# Patient Record
Sex: Female | Born: 1985 | Race: Black or African American | Hispanic: No | Marital: Married | Smoking: Never smoker
Health system: Southern US, Community
[De-identification: ages and names within clinical notes are randomized; demographics above are authoritative.]

## PROBLEM LIST (undated history)

## (undated) ENCOUNTER — Inpatient Hospital Stay (HOSPITAL_COMMUNITY): Payer: Self-pay

## (undated) DIAGNOSIS — Z8619 Personal history of other infectious and parasitic diseases: Secondary | ICD-10-CM

## (undated) DIAGNOSIS — N920 Excessive and frequent menstruation with regular cycle: Secondary | ICD-10-CM

## (undated) HISTORY — PX: OTHER SURGICAL HISTORY: SHX169

## (undated) HISTORY — PX: NO PAST SURGERIES: SHX2092

## (undated) HISTORY — DX: Personal history of other infectious and parasitic diseases: Z86.19

---

## 2005-09-25 ENCOUNTER — Emergency Department (HOSPITAL_COMMUNITY): Admission: EM | Admit: 2005-09-25 | Discharge: 2005-09-25 | Payer: Self-pay | Admitting: Emergency Medicine

## 2005-12-30 ENCOUNTER — Emergency Department (HOSPITAL_COMMUNITY): Admission: EM | Admit: 2005-12-30 | Discharge: 2005-12-30 | Payer: Self-pay | Admitting: Emergency Medicine

## 2010-08-25 LAB — RPR: RPR: NONREACTIVE

## 2010-08-25 LAB — HIV ANTIBODY (ROUTINE TESTING W REFLEX): HIV: NONREACTIVE

## 2010-08-25 LAB — ANTIBODY SCREEN: Antibody Screen: NEGATIVE

## 2010-08-25 LAB — ABO/RH: RH Type: POSITIVE

## 2011-01-09 ENCOUNTER — Other Ambulatory Visit (HOSPITAL_COMMUNITY): Payer: Self-pay | Admitting: Obstetrics and Gynecology

## 2011-01-09 DIAGNOSIS — IMO0001 Reserved for inherently not codable concepts without codable children: Secondary | ICD-10-CM

## 2011-01-16 ENCOUNTER — Other Ambulatory Visit (HOSPITAL_COMMUNITY): Payer: Self-pay | Admitting: Obstetrics and Gynecology

## 2011-01-16 ENCOUNTER — Ambulatory Visit (HOSPITAL_COMMUNITY)
Admission: RE | Admit: 2011-01-16 | Discharge: 2011-01-16 | Disposition: A | Discharge status: Home / Self Care | Payer: BC Managed Care – PPO | Source: Ambulatory Visit | Attending: Obstetrics and Gynecology | Admitting: Obstetrics and Gynecology

## 2011-01-16 ENCOUNTER — Encounter (HOSPITAL_COMMUNITY): Payer: Self-pay

## 2011-01-16 DIAGNOSIS — Z3689 Encounter for other specified antenatal screening: Secondary | ICD-10-CM | POA: Insufficient documentation

## 2011-01-16 DIAGNOSIS — IMO0001 Reserved for inherently not codable concepts without codable children: Secondary | ICD-10-CM

## 2011-01-16 DIAGNOSIS — O30009 Twin pregnancy, unspecified number of placenta and unspecified number of amniotic sacs, unspecified trimester: Secondary | ICD-10-CM | POA: Insufficient documentation

## 2011-01-17 ENCOUNTER — Other Ambulatory Visit: Payer: Self-pay

## 2011-01-19 ENCOUNTER — Other Ambulatory Visit (HOSPITAL_COMMUNITY): Payer: Self-pay | Admitting: Maternal and Fetal Medicine

## 2011-01-19 ENCOUNTER — Ambulatory Visit (HOSPITAL_COMMUNITY)
Admission: RE | Admit: 2011-01-19 | Discharge: 2011-01-19 | Disposition: A | Discharge status: Home / Self Care | Payer: BC Managed Care – PPO | Source: Ambulatory Visit | Attending: Obstetrics and Gynecology | Admitting: Obstetrics and Gynecology

## 2011-01-19 DIAGNOSIS — Z3689 Encounter for other specified antenatal screening: Secondary | ICD-10-CM | POA: Insufficient documentation

## 2011-01-19 DIAGNOSIS — IMO0001 Reserved for inherently not codable concepts without codable children: Secondary | ICD-10-CM

## 2011-01-19 DIAGNOSIS — O30009 Twin pregnancy, unspecified number of placenta and unspecified number of amniotic sacs, unspecified trimester: Secondary | ICD-10-CM | POA: Insufficient documentation

## 2011-01-26 ENCOUNTER — Other Ambulatory Visit (HOSPITAL_COMMUNITY): Payer: Self-pay | Admitting: Maternal and Fetal Medicine

## 2011-01-26 ENCOUNTER — Ambulatory Visit (HOSPITAL_COMMUNITY)
Admission: RE | Admit: 2011-01-26 | Discharge: 2011-01-26 | Disposition: A | Discharge status: Home / Self Care | Payer: BC Managed Care – PPO | Source: Ambulatory Visit | Attending: Obstetrics and Gynecology | Admitting: Obstetrics and Gynecology

## 2011-01-26 DIAGNOSIS — O30039 Twin pregnancy, monochorionic/diamniotic, unspecified trimester: Secondary | ICD-10-CM | POA: Insufficient documentation

## 2011-01-26 DIAGNOSIS — IMO0001 Reserved for inherently not codable concepts without codable children: Secondary | ICD-10-CM

## 2011-01-26 DIAGNOSIS — Z3689 Encounter for other specified antenatal screening: Secondary | ICD-10-CM | POA: Insufficient documentation

## 2011-01-26 DIAGNOSIS — O30009 Twin pregnancy, unspecified number of placenta and unspecified number of amniotic sacs, unspecified trimester: Secondary | ICD-10-CM | POA: Insufficient documentation

## 2011-01-27 ENCOUNTER — Other Ambulatory Visit (HOSPITAL_COMMUNITY): Payer: Self-pay | Admitting: Obstetrics and Gynecology

## 2011-01-27 DIAGNOSIS — IMO0001 Reserved for inherently not codable concepts without codable children: Secondary | ICD-10-CM

## 2011-01-30 ENCOUNTER — Ambulatory Visit (HOSPITAL_COMMUNITY)
Admission: RE | Admit: 2011-01-30 | Discharge: 2011-01-30 | Disposition: A | Discharge status: Home / Self Care | Payer: BC Managed Care – PPO | Source: Ambulatory Visit | Attending: Obstetrics and Gynecology | Admitting: Obstetrics and Gynecology

## 2011-01-30 DIAGNOSIS — Z3689 Encounter for other specified antenatal screening: Secondary | ICD-10-CM | POA: Insufficient documentation

## 2011-01-30 DIAGNOSIS — O30009 Twin pregnancy, unspecified number of placenta and unspecified number of amniotic sacs, unspecified trimester: Secondary | ICD-10-CM | POA: Insufficient documentation

## 2011-01-30 LAB — STREP B DNA PROBE

## 2011-02-01 ENCOUNTER — Encounter (HOSPITAL_COMMUNITY): Payer: Self-pay | Admitting: *Deleted

## 2011-02-01 ENCOUNTER — Telehealth (HOSPITAL_COMMUNITY): Payer: Self-pay | Admitting: *Deleted

## 2011-02-01 NOTE — Telephone Encounter (Signed)
Preadmission screen  

## 2011-02-02 ENCOUNTER — Ambulatory Visit (HOSPITAL_COMMUNITY): Payer: BC Managed Care – PPO

## 2011-02-02 ENCOUNTER — Encounter (HOSPITAL_COMMUNITY): Payer: Self-pay | Admitting: *Deleted

## 2011-02-02 ENCOUNTER — Ambulatory Visit (HOSPITAL_COMMUNITY): Admission: RE | Admit: 2011-02-02 | Payer: BC Managed Care – PPO | Source: Ambulatory Visit

## 2011-02-02 ENCOUNTER — Inpatient Hospital Stay (HOSPITAL_COMMUNITY)
Admission: AD | Admit: 2011-02-02 | Discharge: 2011-02-02 | Disposition: A | Discharge status: Home / Self Care | Payer: BC Managed Care – PPO | Source: Ambulatory Visit | Attending: Obstetrics and Gynecology | Admitting: Obstetrics and Gynecology

## 2011-02-02 DIAGNOSIS — O219 Vomiting of pregnancy, unspecified: Secondary | ICD-10-CM

## 2011-02-02 DIAGNOSIS — O30039 Twin pregnancy, monochorionic/diamniotic, unspecified trimester: Secondary | ICD-10-CM | POA: Insufficient documentation

## 2011-02-02 DIAGNOSIS — O30009 Twin pregnancy, unspecified number of placenta and unspecified number of amniotic sacs, unspecified trimester: Secondary | ICD-10-CM | POA: Insufficient documentation

## 2011-02-02 DIAGNOSIS — O365990 Maternal care for other known or suspected poor fetal growth, unspecified trimester, not applicable or unspecified: Secondary | ICD-10-CM | POA: Insufficient documentation

## 2011-02-02 DIAGNOSIS — K529 Noninfective gastroenteritis and colitis, unspecified: Secondary | ICD-10-CM

## 2011-02-02 MED ORDER — ONDANSETRON 8 MG PO TBDP
8.0000 mg | ORAL_TABLET | Freq: Once | ORAL | Status: AC
  Administered 2011-02-02: 8 mg via ORAL
  Filled 2011-02-02: qty 1

## 2011-02-02 MED ORDER — LACTATED RINGERS IV BOLUS (SEPSIS)
1000.0000 mL | Freq: Once | INTRAVENOUS | Status: AC
  Administered 2011-02-02: 1000 mL via INTRAVENOUS

## 2011-02-02 MED ORDER — ONDANSETRON HCL 4 MG PO TABS: 4.0000 mg | ORAL_TABLET | Freq: Four times a day (QID) | ORAL | Status: AC

## 2011-02-02 NOTE — ED Notes (Signed)
States hx of motion sickness, gets dizzy waves with sudden movement, or when going backwards.  Not a new problem.

## 2011-02-02 NOTE — ED Notes (Signed)
Monitor for baby A not recording/printing correctly.  rm changed.

## 2011-02-02 NOTE — ED Notes (Signed)
Many questions ( induction, lactation/pump rental, cord blood donor)

## 2011-02-02 NOTE — ED Provider Notes (Signed)
History   Pt is a 26 yo G1P0 @ 35+[redacted] weeks pregnant with mono/di twins being followed jointly with MFM for Growth discordance.  Pt presents to [facility]U c/o Nausea and vomiting that started this morning at 5 am. She has had recurrent episodes of emesis.  Denies Fever, chills, diarrhea.  Chief Complaint  Patient presents with  . Nausea   HPI    Past Medical History  Diagnosis Date  . History of chicken pox   . History of chlamydia     Past Surgical History  Procedure Date  . No past surgeries     Family History  Problem Relation Age of Onset  . Anemia Sister   . Hypertension Maternal Aunt   . Diabetes Maternal Aunt   . Cancer Maternal Aunt     breast x3  . Drug abuse Paternal Aunt   . Drug abuse Paternal Uncle   . Cancer Maternal Grandmother     breast  . Alcohol abuse Maternal Grandfather   . Cancer Paternal Grandfather     prostate  . Anesthesia problems Neg Hx     History  Substance Use Topics  . Smoking status: Never Smoker   . Smokeless tobacco: Never Used  . Alcohol Use: No    Allergies: No Known Allergies  Prescriptions prior to admission  Medication Sig Dispense Refill  . PRENATAL VITAMINS PO Take by mouth.          ROS Physical Exam   Blood pressure 137/88, pulse 83, temperature 97.9 F (36.6 C), temperature source Oral, resp. rate 18, last menstrual period 05/28/2010.  Physical Exam  Aox3, NAD Gravid, soft, ND Cvx 1 /50/-2 per RN check, unchanged since last office exam toco irritability with q3-5 contractions FHTs 130 + accels no decels, reactive  150 + accels no decels, reactive   MAU Course  Procedures: IVF hydration and antiemetics   Assessment and Plan  1) Continue IVF hydration 2) D/C home with zofran 3) Per MFM recommendation, patient is scheduled for induction on Sunday PM @ 730 due to growth discordance.  Fetuses are vertex/vertex. Pt and husband are aware of plan of care.  Tapanga Ottaway H. 02/02/2011, 11:05 AM

## 2011-02-02 NOTE — Progress Notes (Signed)
Nausea started last night, vomiting this morning. Denies fever, had loose stool yesterday morning. No one else at home is sick

## 2011-02-03 ENCOUNTER — Ambulatory Visit (HOSPITAL_COMMUNITY)
Admit: 2011-02-03 | Discharge: 2011-02-03 | Disposition: A | Discharge status: Home / Self Care | Payer: BC Managed Care – PPO | Attending: Obstetrics and Gynecology | Admitting: Obstetrics and Gynecology

## 2011-02-03 ENCOUNTER — Other Ambulatory Visit (HOSPITAL_COMMUNITY): Payer: Self-pay | Admitting: Obstetrics and Gynecology

## 2011-02-03 DIAGNOSIS — IMO0001 Reserved for inherently not codable concepts without codable children: Secondary | ICD-10-CM

## 2011-02-04 ENCOUNTER — Encounter (HOSPITAL_COMMUNITY): Payer: Self-pay | Admitting: Pharmacist

## 2011-02-05 ENCOUNTER — Inpatient Hospital Stay (HOSPITAL_COMMUNITY)
Admission: RE | Admit: 2011-02-05 | Discharge: 2011-02-09 | DRG: 370 | Disposition: A | Discharge status: Home / Self Care | Payer: BC Managed Care – PPO | Source: Ambulatory Visit | Attending: Obstetrics & Gynecology | Admitting: Obstetrics & Gynecology

## 2011-02-05 DIAGNOSIS — D500 Iron deficiency anemia secondary to blood loss (chronic): Secondary | ICD-10-CM | POA: Diagnosis present

## 2011-02-05 DIAGNOSIS — O30009 Twin pregnancy, unspecified number of placenta and unspecified number of amniotic sacs, unspecified trimester: Principal | ICD-10-CM | POA: Diagnosis present

## 2011-02-05 DIAGNOSIS — O30039 Twin pregnancy, monochorionic/diamniotic, unspecified trimester: Secondary | ICD-10-CM

## 2011-02-05 DIAGNOSIS — O9902 Anemia complicating childbirth: Secondary | ICD-10-CM | POA: Diagnosis present

## 2011-02-05 LAB — CBC
MCH: 30.1 pg (ref 26.0–34.0)
MCHC: 33.2 g/dL (ref 30.0–36.0)
MCV: 90.4 fL (ref 78.0–100.0)
Platelets: 197 10*3/uL (ref 150–400)
RBC: 3.66 MIL/uL — ABNORMAL LOW (ref 3.87–5.11)
RDW: 13.7 % (ref 11.5–15.5)

## 2011-02-05 MED ORDER — IBUPROFEN 600 MG PO TABS: 600.0000 mg | ORAL_TABLET | Freq: Four times a day (QID) | ORAL | Status: DC | PRN

## 2011-02-05 MED ORDER — OXYCODONE-ACETAMINOPHEN 5-325 MG PO TABS: 2.0000 | ORAL_TABLET | ORAL | Status: DC | PRN

## 2011-02-05 MED ORDER — LACTATED RINGERS IV SOLN
INTRAVENOUS | Status: DC
  Administered 2011-02-05 – 2011-02-06 (×2): via INTRAVENOUS

## 2011-02-05 MED ORDER — LACTATED RINGERS IV SOLN
500.0000 mL | INTRAVENOUS | Status: DC | PRN
  Administered 2011-02-06 (×3): via INTRAVENOUS

## 2011-02-05 MED ORDER — TERBUTALINE SULFATE 1 MG/ML IJ SOLN: 0.2500 mg | Freq: Once | INTRAMUSCULAR | Status: AC | PRN

## 2011-02-05 MED ORDER — LIDOCAINE HCL (PF) 1 % IJ SOLN: 30.0000 mL | INTRAMUSCULAR | Status: DC | PRN

## 2011-02-05 MED ORDER — BUTORPHANOL TARTRATE 2 MG/ML IJ SOLN: 1.0000 mg | INTRAMUSCULAR | Status: DC | PRN

## 2011-02-05 MED ORDER — ONDANSETRON HCL 4 MG/2ML IJ SOLN: 4.0000 mg | Freq: Four times a day (QID) | INTRAMUSCULAR | Status: DC | PRN

## 2011-02-05 MED ORDER — MISOPROSTOL 25 MCG QUARTER TABLET
25.0000 ug | ORAL_TABLET | ORAL | Status: DC | PRN
  Administered 2011-02-05 – 2011-02-06 (×2): 25 ug via VAGINAL
  Filled 2011-02-05 (×2): qty 0.25

## 2011-02-05 MED ORDER — CITRIC ACID-SODIUM CITRATE 334-500 MG/5ML PO SOLN
30.0000 mL | ORAL | Status: DC | PRN
  Administered 2011-02-06: 30 mL via ORAL
  Filled 2011-02-05: qty 15

## 2011-02-05 MED ORDER — OXYTOCIN 20 UNITS IN LACTATED RINGERS INFUSION - SIMPLE: 125.0000 mL/h | Freq: Once | INTRAVENOUS | Status: DC

## 2011-02-05 MED ORDER — OXYTOCIN BOLUS FROM INFUSION
500.0000 mL | Freq: Once | INTRAVENOUS | Status: DC
  Filled 2011-02-05: qty 500

## 2011-02-05 MED ORDER — FLEET ENEMA 7-19 GM/118ML RE ENEM: 1.0000 | ENEMA | RECTAL | Status: DC | PRN

## 2011-02-05 MED ORDER — ACETAMINOPHEN 325 MG PO TABS: 650.0000 mg | ORAL_TABLET | ORAL | Status: DC | PRN

## 2011-02-05 NOTE — H&P (Signed)
26 y.o. G1P0  Estimated Date of Delivery: 03/04/11 admitted at 36/[redacted] weeks gestation for induction. Prenatal course was complicated by dichorionic, monoamniotic twins.   Prenatal labs: Blood Type:A+.  Screening tests for HIV, Syphilis, Hepatitis B, Rubella sensitivity, fetal anomalies, gestational diabetes, and perineal group B strep colonization were negative.    Afebrile, VSS Heart and Lungs: No active disease Abdomen: soft, gravid, EFW AGA. Cervical exam:  1/50  Impression: Di/Mono Twins at 36 weeks  Plan:  Induction

## 2011-02-06 ENCOUNTER — Ambulatory Visit (HOSPITAL_COMMUNITY): Payer: BC Managed Care – PPO

## 2011-02-06 ENCOUNTER — Encounter (HOSPITAL_COMMUNITY): Payer: Self-pay

## 2011-02-06 ENCOUNTER — Encounter (HOSPITAL_COMMUNITY)
Admission: RE | Disposition: A | Discharge status: Home / Self Care | Payer: Self-pay | Source: Ambulatory Visit | Attending: Obstetrics & Gynecology

## 2011-02-06 ENCOUNTER — Other Ambulatory Visit: Payer: Self-pay | Admitting: Obstetrics & Gynecology

## 2011-02-06 SURGERY — Surgical Case
Anesthesia: Spinal | Site: Abdomen | Wound class: Clean Contaminated

## 2011-02-06 MED ORDER — OXYCODONE-ACETAMINOPHEN 5-325 MG PO TABS: 1.0000 | ORAL_TABLET | ORAL | Status: DC | PRN

## 2011-02-06 MED ORDER — OXYTOCIN 20 UNITS IN LACTATED RINGERS INFUSION - SIMPLE
INTRAVENOUS | Status: AC
  Filled 2011-02-06: qty 1000

## 2011-02-06 MED ORDER — SENNOSIDES-DOCUSATE SODIUM 8.6-50 MG PO TABS
2.0000 | ORAL_TABLET | Freq: Every day | ORAL | Status: DC
Start: 2011-02-06 — End: 2011-02-09
  Administered 2011-02-06 – 2011-02-08 (×3): 2 via ORAL

## 2011-02-06 MED ORDER — MEPERIDINE HCL 25 MG/ML IJ SOLN: 6.2500 mg | INTRAMUSCULAR | Status: DC | PRN

## 2011-02-06 MED ORDER — NALOXONE HCL 0.4 MG/ML IJ SOLN: 1.0000 ug/kg/h | INTRAMUSCULAR | Status: DC | PRN

## 2011-02-06 MED ORDER — DIBUCAINE 1 % RE OINT: 1.0000 "application " | TOPICAL_OINTMENT | RECTAL | Status: DC | PRN

## 2011-02-06 MED ORDER — OXYTOCIN 10 UNIT/ML IJ SOLN
INTRAMUSCULAR | Status: DC | PRN
  Administered 2011-02-06: 20 [IU]
  Administered 2011-02-06: 20 [IU] via INTRAMUSCULAR

## 2011-02-06 MED ORDER — SIMETHICONE 80 MG PO CHEW
80.0000 mg | CHEWABLE_TABLET | Freq: Three times a day (TID) | ORAL | Status: DC
Start: 2011-02-06 — End: 2011-02-09
  Administered 2011-02-06 – 2011-02-09 (×9): 80 mg via ORAL

## 2011-02-06 MED ORDER — KETOROLAC TROMETHAMINE 30 MG/ML IJ SOLN: 15.0000 mg | Freq: Once | INTRAMUSCULAR | Status: DC | PRN

## 2011-02-06 MED ORDER — OXYTOCIN 20 UNITS IN LACTATED RINGERS INFUSION - SIMPLE
125.0000 mL/h | INTRAVENOUS | Status: AC
  Administered 2011-02-06: 125 mL/h via INTRAVENOUS

## 2011-02-06 MED ORDER — KETOROLAC TROMETHAMINE 60 MG/2ML IM SOLN
60.0000 mg | Freq: Once | INTRAMUSCULAR | Status: AC | PRN
  Filled 2011-02-06: qty 2

## 2011-02-06 MED ORDER — TETANUS-DIPHTH-ACELL PERTUSSIS 5-2.5-18.5 LF-MCG/0.5 IM SUSP: 0.5000 mL | Freq: Once | INTRAMUSCULAR | Status: DC

## 2011-02-06 MED ORDER — CEFAZOLIN SODIUM-DEXTROSE 2-3 GM-% IV SOLR
2.0000 g | Freq: Once | INTRAVENOUS | Status: AC
  Administered 2011-02-06: 2 g via INTRAVENOUS
  Filled 2011-02-06: qty 50

## 2011-02-06 MED ORDER — ONDANSETRON HCL 4 MG/2ML IJ SOLN
INTRAMUSCULAR | Status: DC | PRN
  Administered 2011-02-06: 4 mg via INTRAVENOUS

## 2011-02-06 MED ORDER — WITCH HAZEL-GLYCERIN EX PADS: 1.0000 "application " | MEDICATED_PAD | CUTANEOUS | Status: DC | PRN

## 2011-02-06 MED ORDER — ONDANSETRON HCL 4 MG/2ML IJ SOLN
4.0000 mg | Freq: Three times a day (TID) | INTRAMUSCULAR | Status: DC | PRN
  Administered 2011-02-06: 4 mg via INTRAVENOUS
  Filled 2011-02-06: qty 2

## 2011-02-06 MED ORDER — SCOPOLAMINE 1 MG/3DAYS TD PT72
1.0000 | MEDICATED_PATCH | Freq: Once | TRANSDERMAL | Status: AC
  Administered 2011-02-06: 1.5 mg via TRANSDERMAL

## 2011-02-06 MED ORDER — LANOLIN HYDROUS EX OINT: 1.0000 "application " | TOPICAL_OINTMENT | CUTANEOUS | Status: DC | PRN

## 2011-02-06 MED ORDER — MORPHINE SULFATE (PF) 0.5 MG/ML IJ SOLN
INTRAMUSCULAR | Status: DC | PRN
  Administered 2011-02-06: .2 mg via INTRATHECAL

## 2011-02-06 MED ORDER — KETOROLAC TROMETHAMINE 30 MG/ML IJ SOLN: 30.0000 mg | Freq: Four times a day (QID) | INTRAMUSCULAR | Status: AC | PRN

## 2011-02-06 MED ORDER — NALOXONE HCL 0.4 MG/ML IJ SOLN: 0.4000 mg | INTRAMUSCULAR | Status: DC | PRN

## 2011-02-06 MED ORDER — HYDROMORPHONE HCL PF 1 MG/ML IJ SOLN: 0.2500 mg | INTRAMUSCULAR | Status: DC | PRN

## 2011-02-06 MED ORDER — SIMETHICONE 80 MG PO CHEW: 80.0000 mg | CHEWABLE_TABLET | ORAL | Status: DC | PRN

## 2011-02-06 MED ORDER — PROMETHAZINE HCL 25 MG/ML IJ SOLN: 6.2500 mg | INTRAMUSCULAR | Status: DC | PRN

## 2011-02-06 MED ORDER — IBUPROFEN 600 MG PO TABS
600.0000 mg | ORAL_TABLET | Freq: Four times a day (QID) | ORAL | Status: DC
  Administered 2011-02-07 – 2011-02-09 (×10): 600 mg via ORAL
  Filled 2011-02-06 (×6): qty 1

## 2011-02-06 MED ORDER — LACTATED RINGERS IV SOLN
INTRAVENOUS | Status: DC
  Administered 2011-02-06: 16:00:00 via INTRAVENOUS

## 2011-02-06 MED ORDER — BUPIVACAINE IN DEXTROSE 0.75-8.25 % IT SOLN
INTRATHECAL | Status: DC | PRN
  Administered 2011-02-06: 1.6 mL via INTRATHECAL

## 2011-02-06 MED ORDER — NALBUPHINE HCL 10 MG/ML IJ SOLN
5.0000 mg | INTRAMUSCULAR | Status: DC | PRN
  Filled 2011-02-06: qty 1

## 2011-02-06 MED ORDER — SODIUM CHLORIDE 0.9 % IJ SOLN: 3.0000 mL | INTRAMUSCULAR | Status: DC | PRN

## 2011-02-06 MED ORDER — FENTANYL CITRATE 0.05 MG/ML IJ SOLN
INTRAMUSCULAR | Status: DC | PRN
  Administered 2011-02-06: 25 ug via INTRATHECAL
  Administered 2011-02-06 (×2): 25 ug via INTRAVENOUS

## 2011-02-06 MED ORDER — ONDANSETRON HCL 4 MG PO TABS: 4.0000 mg | ORAL_TABLET | ORAL | Status: DC | PRN

## 2011-02-06 MED ORDER — MENTHOL 3 MG MT LOZG: 1.0000 | LOZENGE | OROMUCOSAL | Status: DC | PRN

## 2011-02-06 MED ORDER — ONDANSETRON HCL 4 MG/2ML IJ SOLN
4.0000 mg | INTRAMUSCULAR | Status: DC | PRN
  Administered 2011-02-06: 4 mg via INTRAVENOUS

## 2011-02-06 MED ORDER — DIPHENHYDRAMINE HCL 25 MG PO CAPS: 25.0000 mg | ORAL_CAPSULE | Freq: Four times a day (QID) | ORAL | Status: DC | PRN

## 2011-02-06 MED ORDER — IBUPROFEN 600 MG PO TABS
600.0000 mg | ORAL_TABLET | Freq: Four times a day (QID) | ORAL | Status: DC | PRN
  Filled 2011-02-06 (×6): qty 1

## 2011-02-06 MED ORDER — PRENATAL MULTIVITAMIN CH
1.0000 | ORAL_TABLET | Freq: Every day | ORAL | Status: DC
Start: 2011-02-06 — End: 2011-02-09
  Administered 2011-02-07 – 2011-02-09 (×3): 1 via ORAL
  Filled 2011-02-06 (×3): qty 1

## 2011-02-06 MED ORDER — EPHEDRINE SULFATE 50 MG/ML IJ SOLN
INTRAMUSCULAR | Status: DC | PRN
  Administered 2011-02-06 (×4): 10 mg via INTRAVENOUS

## 2011-02-06 MED ORDER — DIPHENHYDRAMINE HCL 50 MG/ML IJ SOLN: 25.0000 mg | INTRAMUSCULAR | Status: DC | PRN

## 2011-02-06 MED ORDER — ZOLPIDEM TARTRATE 5 MG PO TABS: 5.0000 mg | ORAL_TABLET | Freq: Every evening | ORAL | Status: DC | PRN

## 2011-02-06 MED ORDER — DIPHENHYDRAMINE HCL 50 MG/ML IJ SOLN: 12.5000 mg | INTRAMUSCULAR | Status: DC | PRN

## 2011-02-06 MED ORDER — DIPHENHYDRAMINE HCL 25 MG PO CAPS: 25.0000 mg | ORAL_CAPSULE | ORAL | Status: DC | PRN

## 2011-02-06 SURGICAL SUPPLY — 29 items
CLOTH BEACON ORANGE TIMEOUT ST (SAFETY) ×2 IMPLANT
DRESSING TELFA 8X3 (GAUZE/BANDAGES/DRESSINGS) ×2 IMPLANT
ELECT REM PT RETURN 9FT ADLT (ELECTROSURGICAL) ×2
ELECTRODE REM PT RTRN 9FT ADLT (ELECTROSURGICAL) ×1 IMPLANT
EXTRACTOR VACUUM M CUP 4 TUBE (SUCTIONS) IMPLANT
GAUZE SPONGE 4X4 12PLY STRL LF (GAUZE/BANDAGES/DRESSINGS) ×4 IMPLANT
GLOVE BIO SURGEON STRL SZ7.5 (GLOVE) ×4 IMPLANT
GOWN PREVENTION PLUS LG XLONG (DISPOSABLE) ×4 IMPLANT
GOWN PREVENTION PLUS XLARGE (GOWN DISPOSABLE) ×2 IMPLANT
KIT ABG SYR 3ML LUER SLIP (SYRINGE) IMPLANT
NEEDLE HYPO 25X5/8 SAFETYGLIDE (NEEDLE) IMPLANT
NS IRRIG 1000ML POUR BTL (IV SOLUTION) ×2 IMPLANT
PACK C SECTION WH (CUSTOM PROCEDURE TRAY) ×2 IMPLANT
PAD ABD 7.5X8 STRL (GAUZE/BANDAGES/DRESSINGS) ×2 IMPLANT
PAD ABD DERMACEA PRESS 5X9 (GAUZE/BANDAGES/DRESSINGS) ×2 IMPLANT
SLEEVE SCD COMPRESS KNEE MED (MISCELLANEOUS) IMPLANT
STAPLER VISISTAT 35W (STAPLE) IMPLANT
SUT PLAIN 1 NONE 54 (SUTURE) ×2 IMPLANT
SUT PROLENE 0 TP 1 60 (SUTURE) IMPLANT
SUT VIC AB 0 CT1 27 (SUTURE) ×3
SUT VIC AB 0 CT1 27XBRD ANBCTR (SUTURE) ×3 IMPLANT
SUT VIC AB 1 CTX 36 (SUTURE) ×2
SUT VIC AB 1 CTX36XBRD ANBCTRL (SUTURE) ×2 IMPLANT
SUT VIC AB 3-0 SH 27 (SUTURE)
SUT VIC AB 3-0 SH 27X BRD (SUTURE) IMPLANT
SUT VICRYL 4-0 PS2 18IN ABS (SUTURE) IMPLANT
TOWEL OR 17X24 6PK STRL BLUE (TOWEL DISPOSABLE) ×4 IMPLANT
TRAY FOLEY CATH 14FR (SET/KITS/TRAYS/PACK) ×2 IMPLANT
WATER STERILE IRR 1000ML POUR (IV SOLUTION) ×2 IMPLANT

## 2011-02-06 NOTE — Anesthesia Postprocedure Evaluation (Signed)
Anesthesia Post Note  Patient: Miranda Bray  Procedure(s) Performed:  CESAREAN SECTION - Primary/twins  Anesthesia type: Spinal  Patient location: PACU  Post pain: Pain level controlled  Post assessment: Post-op Vital signs reviewed  Last Vitals:  Filed Vitals:   02/06/11 1027  BP: 126/54  Pulse: 82  Temp: 36.4 C  Resp: 20    Post vital signs: Reviewed  Level of consciousness: awake  Complications: No apparent anesthesia complications

## 2011-02-06 NOTE — Transfer of Care (Signed)
Immediate Anesthesia Transfer of Care Note  Patient: Miranda Bray  Procedure(s) Performed:  CESAREAN SECTION - Primary/twins  Patient Location: PACU  Anesthesia Type: Spinal  Level of Consciousness: awake, alert  and oriented  Airway & Oxygen Therapy: Patient Spontanous Breathing  Post-op Assessment: Report given to PACU RN and Post -op Vital signs reviewed and stable  Post vital signs: stable  Complications: No apparent anesthesia complications

## 2011-02-06 NOTE — Anesthesia Procedure Notes (Signed)
Spinal  Patient location during procedure: OR Start time: 02/06/2011 9:25 AM End time: 02/06/2011 9:28 AM Staffing Anesthesiologist: Sandrea Hughs Performed by: anesthesiologist  Preanesthetic Checklist Completed: patient identified, site marked, surgical consent, pre-op evaluation, timeout performed, IV checked, risks and benefits discussed and monitors and equipment checked Spinal Block Patient position: sitting Prep: DuraPrep Patient monitoring: heart rate, cardiac monitor, continuous pulse ox and blood pressure Approach: midline Location: L3-4 Injection technique: single-shot Needle Needle type: Sprotte  Needle gauge: 24 G Needle length: 9 cm Needle insertion depth: 6 cm Assessment Sensory level: T4

## 2011-02-06 NOTE — Consult Note (Signed)
Called to attend Di/MO twin preterm gestation C/S at 36 weeks 2 days. On advice of MFM this mode of delivery was suggested. Other than the reported discordancy in fetal size relative to each other, no other risk factors have been noted.    At delivery twin A was in vertex and manually extracted with spontaneous cries and active tone noted. This twin appears growth restricted and was considered to be less than or equal to 4 1/2 pounds; scaled was requested and the weight was ~ 1840 gm. Infant given tactile stim and bulb suction with minimal results. No dysmorphic features. Apgar of 9/9 @ 1/5 minutes assigned. Infant voided in OR.    Based on gestational age > 36 weeks, the clinical exam supportive of infant being mature and entirely appropriate for staging this infant in the Transitional Nursery. Nursing and medical director of NICU so informed.    Care to assigned pediatrician.    Dagoberto Ligas MD [facility]

## 2011-02-06 NOTE — Progress Notes (Signed)
Post Op Check  Patient and Babies all doing well!!  VS stable.  Dressing dry.

## 2011-02-06 NOTE — Progress Notes (Signed)
After two doses of cytotec Cx at best 1-2/40/vtx A -3, ballotable.  FHs reassuring.  I have discussed progressing with low dose pitocin vs. proceeding to Primary C/S.  I have answered questions and will check back for further discussion and ?decison shortly.

## 2011-02-06 NOTE — Progress Notes (Signed)
Patient and Family have decided to proceed to C/S for delivery.  All questions have been answered.  Pre op Ancef, 2 gm. Ordered. Will proceed when OR ready.  FHs ressuring.

## 2011-02-06 NOTE — Consult Note (Signed)
Called to attend Di/MO twin preterm gestation C/S at 36 weeks 2 days. On advice of MFM this mode of delivery was suggested. Other than the reported discordancy in fetal size relative to each other, no other risk factors have been noted.    At delivery twin B was in vertex and manually extracted with spontaneous cries and active tone noted. This twin appears appropriately grown.   Infant given tactile stim and bulb suction with minimal results. No dysmorphic features. Apgar of 9/9 @ 1/5 minutes assigned. Infant voided in OR.   Both infants shown to parents and then carried to Transitional Nursery in warm blanket.   Care to assigned pediatrician.    Dagoberto Ligas MD [facility]

## 2011-02-06 NOTE — Op Note (Signed)
Preperative diagnoses-  #1-36 week and 2 day intrauterine pregnancy, mono/di Twins with some growth discordancy-A smaller than B  #2-delivery recommended by maternal fetal medicine  Postoperative diagnoses-  Same plus delivery of baby A-4 pounds-female-Apgars 9 and 9, vertex and delivery of baby B-5 lbs. 2 oz.-female-Apgars 9 and 9 -transverse to vertex  Procedure-primary low transverse cesarean section  Surgeon-Dr. Aldona Bar  Anesthesia-subarachnoid block Dr. Lilli Light  History-this 26 year old G1 P0 was admitted for induction of labor on the evening of 1/6 at 36 weeks and one-day gestation upon the recommendation of maternal fetal medicine. Cytotec was placed twice and on my evaluation at 7:30 AM on 1/7 I found the patient to be 1-2 cm dilated 40% effaced with vertex of baby A presenting at -3 station-ballotable. Discussion was carried out with the patient and her family as to continuing the induction with Pitocin versus primary cesarean section. Risks and benefits were discussed and all questions answered. Decision was made to proceed with delivery by primary cesarean section.  Procedure-the patient was taken to the operating room where a subarachnoid block was carried out by Dr. Ninetta Lights without difficulty. Thereafter the patient was placed in the supine position slightly tilted to the left and was prepped and draped in the usual fashion for cesarean section with Foley catheter placed.  Good anesthetic levels were documented and procedure was begun. A Pfannenstiel incision was made and with minimal difficulty dissected down sharply to and through the fascia with hemostasis created at each layer. Subfascial space was created for early and superiorly muscle separated in the midline peritoneum identified and entered peripherally with care taken to avoid the bowel superiorly and the bladder inferiorly. At this time the vesicouterine peritoneum was identified and incised in a low transverse fashion and  pushed off the lower uterine segment. Sharp incision into the into the uterus was then made with the Metzenbaum scissors a low transverse fashion. Amniotomy was produced production of clear fluid infant vertex position baby A was delivered and after cord clamped and cut baby A was passed off to the waiting team head up by Dr. Alison Murray. Baby A ultimately was found to weigh 4 pounds, was female, Apgars were 9 and 9.  At this time amniotomy was carried out for baby B-fluid was clear. Baby B was initially an unstable vertex and moved to a back down transverse position and this was corrected and ultimately baby B was delivered from a vertex position. Baby B throughout spontaneously it once and after the cord was clamped and cut was passed off to the waiting team. Weight was 5 lbs. 2 oz., baby was female. Apgars are 9 and 9.  Both babies were ultimately taken to the newborn nursery in good condition.  At this time all cord bloods were collected both baby A and baby B and the placenta was delivered and sent to pathology appropriately labeled. At this time the uterus was exteriorized and rendered free the remaining products of conception. Good uterine contractility was afforded with slowly given intravenous Pitocin and manual stimulation.  At this time the uterine incision was closed-the first layer was a single layer of #1 Vicryl in a running locking fashion and this was oversewn with several figure-of-eight #1 Vicryl with excellent result and hemostasis. The abdomen at this time was a large raw free blood and clot. All counts were noted be correct and no foreign bodies were noted to be remaining in the abdominal cavity. The uterus was fairly well contracted and was replaced in  the abdominal cavity. Closure of the abdomen at this time was begun in layers.  The abdominal peritoneum was closed with 0 Vicryl in a running fashion the muscle secured the same. Assured of good subfascial hemostasis the fascia was then  reapproximated using 0 Vicryl from angle to midline bilaterally. Subcutaneous tissue was rendered hemostatic and staples were then used to close the skin. A sterile pressure dressing was applied and the patient was taken to the recovery room in satisfactory condition having tolerated suture well.  Estimated blood loss 900cc.  All counts correct x2.  At the conclusion of the procedure both mother and babies were doing well in their respective recovery areas.

## 2011-02-06 NOTE — Anesthesia Preprocedure Evaluation (Signed)
Anesthesia Evaluation  Patient identified by MRN, date of birth, ID band Patient awake    Reviewed: Allergy & Precautions, H&P , NPO status , Patient's Chart, lab work & pertinent test results  Airway Mallampati: II TM Distance: >3 FB Neck ROM: full    Dental No notable dental hx.    Pulmonary neg pulmonary ROS,    Pulmonary exam normal       Cardiovascular neg cardio ROS     Neuro/Psych Negative Neurological ROS  Negative Psych ROS   GI/Hepatic negative GI ROS, Neg liver ROS,   Endo/Other  Morbid obesity  Renal/GU negative Renal ROS  Genitourinary negative   Musculoskeletal negative musculoskeletal ROS (+)   Abdominal (+) obese,   Peds negative pediatric ROS (+)  Hematology negative hematology ROS (+)   Anesthesia Other Findings   Reproductive/Obstetrics (+) Pregnancy                           Anesthesia Physical Anesthesia Plan  ASA: III and Emergent  Anesthesia Plan: Spinal   Post-op Pain Management:    Induction:   Airway Management Planned:   Additional Equipment:   Intra-op Plan:   Post-operative Plan:   Informed Consent: I have reviewed the patients History and Physical, chart, labs and discussed the procedure including the risks, benefits and alternatives for the proposed anesthesia with the patient or authorized representative who has indicated his/her understanding and acceptance.     Plan Discussed with: CRNA  Anesthesia Plan Comments:         Anesthesia Quick Evaluation

## 2011-02-07 ENCOUNTER — Encounter (HOSPITAL_COMMUNITY): Payer: Self-pay | Admitting: Obstetrics & Gynecology

## 2011-02-07 LAB — CBC
Hemoglobin: 6.7 g/dL — CL (ref 12.0–15.0)
Platelets: 132 10*3/uL — ABNORMAL LOW (ref 150–400)
RBC: 2.19 MIL/uL — ABNORMAL LOW (ref 3.87–5.11)
WBC: 11.1 10*3/uL — ABNORMAL HIGH (ref 4.0–10.5)

## 2011-02-07 MED ORDER — FERROUS SULFATE 325 (65 FE) MG PO TABS
325.0000 mg | ORAL_TABLET | Freq: Three times a day (TID) | ORAL | Status: DC
  Administered 2011-02-07 – 2011-02-09 (×4): 325 mg via ORAL
  Filled 2011-02-07 (×4): qty 1

## 2011-02-07 MED ORDER — DOCUSATE SODIUM 100 MG PO CAPS: 100.0000 mg | ORAL_CAPSULE | Freq: Two times a day (BID) | ORAL | Status: DC | PRN

## 2011-02-07 NOTE — Addendum Note (Signed)
Addendum  created 02/07/11 1253 by Doreene Burke   Modules edited:Notes Section

## 2011-02-07 NOTE — Progress Notes (Signed)
Physician Aldona Bar called to report Pt HgB of 6.7 on 02/07/11 down from a HgB of 11.0 on 02/06/11, no new order at this time will continue to monitor.

## 2011-02-07 NOTE — Anesthesia Postprocedure Evaluation (Signed)
Anesthesia Post Note  Patient: Miranda Bray  Procedure(s) Performed:  CESAREAN SECTION - Primary/twins  Anesthesia type: SAB  Patient location: Mother/Baby  Post pain: Pain level controlled  Post assessment: Post-op Vital signs reviewed  Last Vitals:  Filed Vitals:   02/07/11 0930  BP: 111/72  Pulse: 96  Temp: 36.8 C  Resp: 20    Post vital signs: Reviewed  Level of consciousness: awake  Complications: No apparent anesthesia complications

## 2011-02-07 NOTE — Progress Notes (Signed)
Patient is eating, ambulating, voiding.  Pain control is good. Pt denies dizzyness, lightheadedness.  Bleeding is minimal.  Denies flatus.  Denies N/V/CP/SOB.  Filed Vitals:   02/06/11 2141 02/06/11 2142 02/07/11 0145 02/07/11 0545  BP: 117/75 121/78 124/79 105/67  Pulse: 91 112 92 82  Temp:   98.4 F (36.9 C) 98.1 F (36.7 C)  TempSrc:   Oral Oral  Resp: 18 20 20 20   Height:      Weight:      SpO2: 98%       Fundus firm Inc: drsg c/d/i Ext: b/l +1 pedal edema  Lab Results  Component Value Date   WBC 11.1* 02/07/2011   HGB 6.7* 02/07/2011   HCT 19.6* 02/07/2011   MCV 89.5 02/07/2011   PLT 132* 02/07/2011    A/Positive/-- (07/26 0000)  A/P Post op day #1 s/p c/s - twins Anemia due to pp hemorrhage - start ferrous sulfate tid, colace 100 bid Continue other routine care.   Miranda Bray

## 2011-02-07 NOTE — Progress Notes (Signed)
Aware of hemoglobin of 6.7.  Not surprising considering amount of blood loss after delivery from post partum atony of the uterus.  Patient doing well otherwise.

## 2011-02-08 NOTE — Progress Notes (Signed)
Mother and Babies doing well.  Flatus+ and BM+.  Breast feeding.  Incision dry.  Fundus firm.  Probably D/C AM 1/10-discussed Iron use to improve Hgb., etc.

## 2011-02-09 ENCOUNTER — Encounter (HOSPITAL_COMMUNITY)
Admission: RE | Admit: 2011-02-09 | Discharge: 2011-02-09 | Disposition: A | Discharge status: Home / Self Care | Payer: BC Managed Care – PPO | Source: Ambulatory Visit | Attending: Obstetrics and Gynecology | Admitting: Obstetrics and Gynecology

## 2011-02-09 ENCOUNTER — Other Ambulatory Visit (HOSPITAL_COMMUNITY): Payer: BC Managed Care – PPO

## 2011-02-09 DIAGNOSIS — O923 Agalactia: Secondary | ICD-10-CM | POA: Insufficient documentation

## 2011-02-09 LAB — URINALYSIS, ROUTINE W REFLEX MICROSCOPIC
Glucose, UA: NEGATIVE mg/dL
Leukocytes, UA: NEGATIVE
Nitrite: NEGATIVE
Specific Gravity, Urine: <1.005 — ABNORMAL LOW (ref 1.005–1.030)
pH: 7 (ref 5.0–8.0)

## 2011-02-09 MED ORDER — OXYCODONE-ACETAMINOPHEN 5-325 MG PO TABS: 1.0000 | ORAL_TABLET | ORAL | Status: AC | PRN

## 2011-02-09 NOTE — Progress Notes (Signed)
Patient is eating, ambulating, voiding.  Pain control is good.  Filed Vitals:   02/08/11 0602 02/08/11 1338 02/08/11 2200 02/09/11 0555  BP: 109/66 126/87 132/84 149/82  Pulse: 97 112 101 102  Temp: 98.6 F (37 C) 98.6 F (37 C) 98.5 F (36.9 C) 98.7 F (37.1 C)  TempSrc: Oral Oral Oral Oral  Resp: 20 20 20 20   Height:      Weight:      SpO2:        Fundus firm Perineum without swelling.  Lab Results  Component Value Date   WBC 11.1* 02/07/2011   HGB 6.7* 02/07/2011   HCT 19.6* 02/07/2011   MCV 89.5 02/07/2011   PLT 132* 02/07/2011    A/Positive/-- (07/26 0000)/RI  A/P Post partum day 2.  Iron for anemia.  Will check BPs through day and d/c if not increasing.  Routine care otherwise.  Miranda Bray A

## 2011-02-09 NOTE — Discharge Summary (Signed)
Obstetric Discharge Summary Reason for Admission: induction of labor Prenatal Procedures: ultrasound Intrapartum Procedures: cesarean: low cervical, transverse Postpartum Procedures: none Complications-Operative and Postpartum: increased blood pressures Hemoglobin  Date Value Range Status  02/07/2011 6.7* 12.0-15.0 (g/dL) Final     CRITICAL RESULT CALLED TO, READ BACK BY AND VERIFIED WITH:     KING,P. AT 1610 ON 02/07/2011 BY HOUEGNIFIO M.     REPEATED TO VERIFY     DELTA CHECK NOTED     HCT  Date Value Range Status  02/07/2011 19.6* 36.0-46.0 (%) Final   Hospital course: Pt admitted for induction of mono/di twins with slight discord at 36+ weeks.  Overnight cytotec did not open cervix much; doctor following morning d/w patient option of c/s versus prolonged induction.  Pt decided on c/s.  Postoperative course relatively benign until POD 3 when BPs became elevated to occasional 140/90s.  UA sent, negative for protein.   Pt requested d/c and was given instructions for bedrest and f/u bp at office tomorrow.  Discharge Diagnoses: twin delivery mono/di at 36 weeks for slight discord  Discharge Information: Date: 02/09/2011 Activity: modified bedrest Diet: routine Medications: Percocet Condition: stable Instructions: refer to practice specific booklet Discharge to: home Follow-up Information    Follow up with [name], MD. Schedule an appointment as soon as possible for a visit in 1 day.   Contact information:   [address] [place] [address] [phone]          Newborn Data:   Marshawn, Ninneman [[id number]]  Live born female  Birth Weight: 4 lb 0.9 oz (1840 g) APGAR: 9, 9   Mitzy, Naron [[id number]]  Live born female  Birth Weight: 5 lb 2.4 oz (2335 g) APGAR: 9, 9  Home with mother.  Lanique Gonzalo A 02/09/2011, 11:39 AM

## 2011-02-09 NOTE — Progress Notes (Signed)
Addendum to last note:  Pt was very teary when informed that I would like her to stay one more day to ensure she did not develop preeclampsia.  She says that her bps are elevated because she is upset that she wants to go home.  D/w pt risks of preeclampsia and will send home if no proteinuria.  Meah Jiron A

## 2011-02-09 NOTE — Progress Notes (Signed)
Filed Vitals:   02/09/11 0555 02/09/11 0800 02/09/11 0900 02/09/11 1000  BP: 149/82 120/80 138/90 126/80  Pulse: 102 98 96 90  Temp: 98.7 F (37.1 C)     TempSrc: Oral     Resp: 20 18    Height:      Weight:      SpO2:        BPs elevated when pt is stressed; no HTN ever before today pp.  D/w pt risks of preeclampsia.  Will send cath urine for protein.  If negative, ok for d/c with bedrest and f/u bp in office.  Deitrick Ferreri A

## 2011-03-03 ENCOUNTER — Ambulatory Visit (HOSPITAL_COMMUNITY)
Admission: RE | Admit: 2011-03-03 | Discharge: 2011-03-03 | Disposition: A | Discharge status: Home / Self Care | Payer: BC Managed Care – PPO | Source: Ambulatory Visit | Attending: Obstetrics & Gynecology | Admitting: Obstetrics & Gynecology

## 2011-03-03 NOTE — Progress Notes (Signed)
Adult Lactation Consultation Outpatient Visit Note  Patient Name: Miranda Bray Date of Birth: Jul 17, 1985 Gestational Age at Delivery: Unknown Type of Delivery:   Breastfeeding History: Frequency of Breastfeeding: Q 2-3 during day q 4 at night Length of Feeding: Baby A 15-20 Minutes, Baby B 40 minutes Voids: qs for both Stools: qs for both  Supplementing / Method: Pumping:  Type of Pump: [employer] rental from hospital   Frequency: q 4-5  Volume:  1/2 -1 1/2 oz  Comments:    Consultation Evaluation:  Initial Feeding Assessment: Baby A Brianna Pre-feed Weight:5-15.2  2702 g Post-feed Weight:5 15.9 2718g Amount Transferred:16 ccs Comments:Baby A Brianna nursed for 15 minutes- good vigorous sucks for the first few minutes with swallows noted, after a few minutes baby sleepy and non nutritive sucking noted. Mom reports that this is a usually feeding for [place] Chasity feeds for longer periods but still some parts of feeding non nutritive. Mom has been bottlefeeding EBM plus formula pc 2-2 1/2 oz for Brianna and 3 oz for Chasity. Good weight gain. Mom did not want to feed Chasity while she was here since she felt her milk supply had decreased and with Brianna's feeding proved that supply was down.  Additional Feeding Assessment: Pre-feed Weight: Post-feed Weight: Amount Transferred: Comments:  Additional Feeding Assessment: Pre-feed Weight: Post-feed Weight: Amount Transferred: Comments:  Total Breast milk Transferred this Visit:  Total Supplement Given:   Additional Interventions: Mom to get back to pumping after every feeding to promote milk supply. Suggested using SNS to supplement babies at breast to encourage more vigorous sucking ( mom has used this before and reports she feels comfortable with use) Information given to Mom about M[place]a to increase milk supply.    Follow-Up Mom plans to come to [facility] on Tuesday to weight babies.  Encouraged to call prn for  OP appointment if needed or questions     Pamelia Hoit 03/03/2011, 2:04 PM

## 2011-03-13 ENCOUNTER — Encounter (HOSPITAL_COMMUNITY)
Admission: RE | Admit: 2011-03-13 | Discharge: 2011-03-13 | Disposition: A | Discharge status: Home / Self Care | Payer: BC Managed Care – PPO | Source: Ambulatory Visit | Attending: Obstetrics and Gynecology | Admitting: Obstetrics and Gynecology

## 2011-03-13 DIAGNOSIS — O923 Agalactia: Secondary | ICD-10-CM | POA: Insufficient documentation

## 2011-04-12 ENCOUNTER — Encounter (HOSPITAL_COMMUNITY)
Admission: RE | Admit: 2011-04-12 | Discharge: 2011-04-12 | Disposition: A | Discharge status: Home / Self Care | Payer: BC Managed Care – PPO | Source: Ambulatory Visit | Attending: Obstetrics and Gynecology | Admitting: Obstetrics and Gynecology

## 2011-04-12 DIAGNOSIS — O923 Agalactia: Secondary | ICD-10-CM | POA: Insufficient documentation

## 2012-05-24 IMAGING — US US FETAL BPP W/O NONSTRESS
1 series · 14 of 28 positions shown · non-contrast
Comparison: none

[Series 1: us fetal bpp w/o nonstress · 32 acquisitions, 14 frames shown]
[im 2/32]
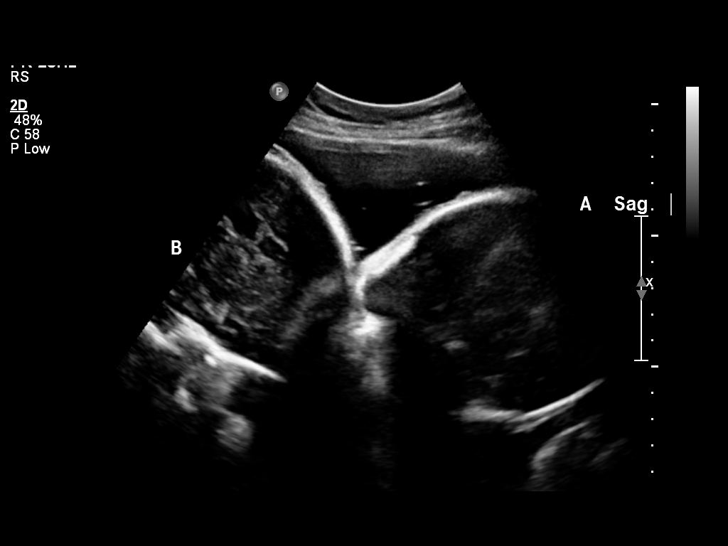
[im 4/32]
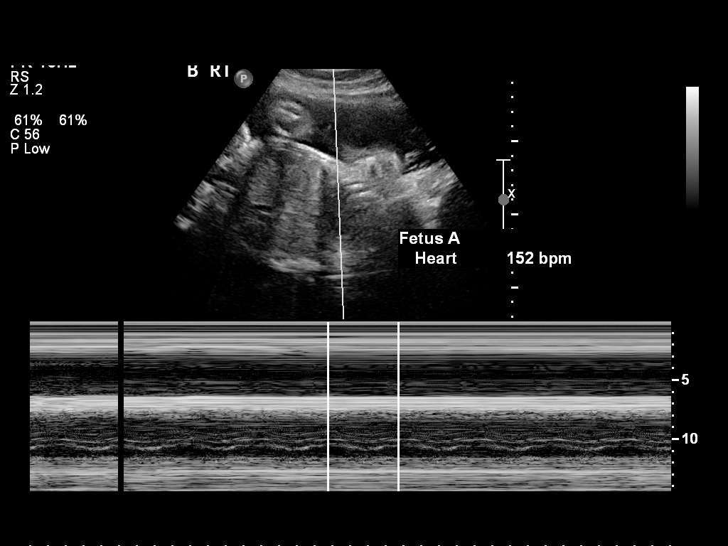
[im 6/32]
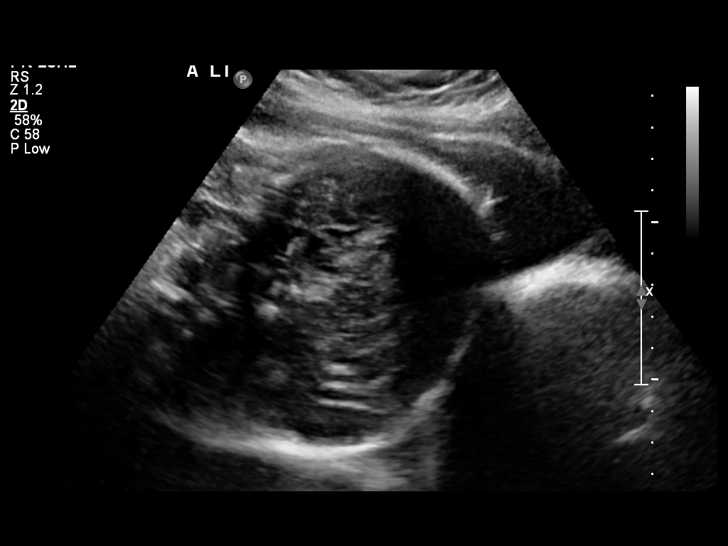
[im 9/32]
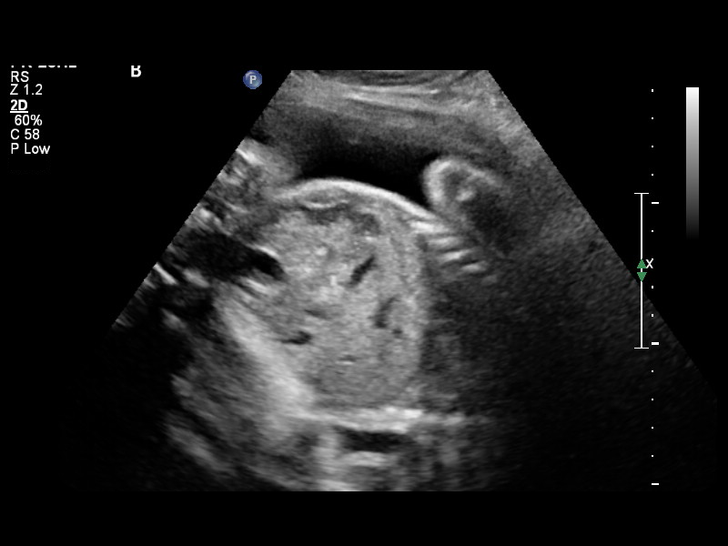
[im 11/32]
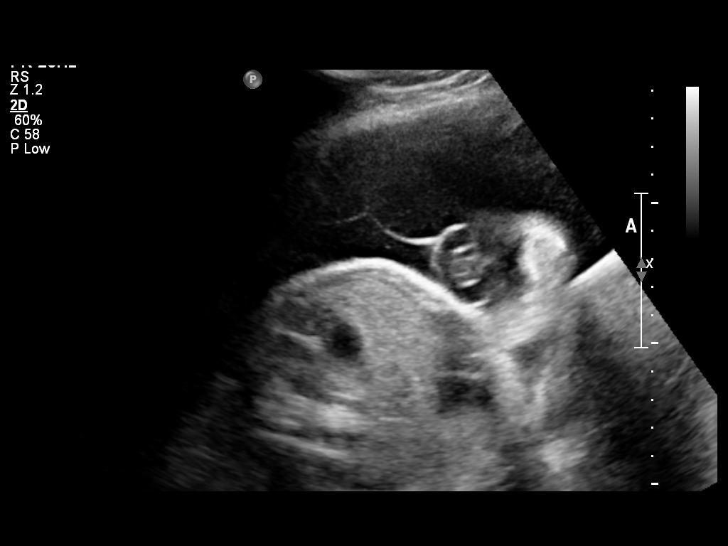
[im 13/32]
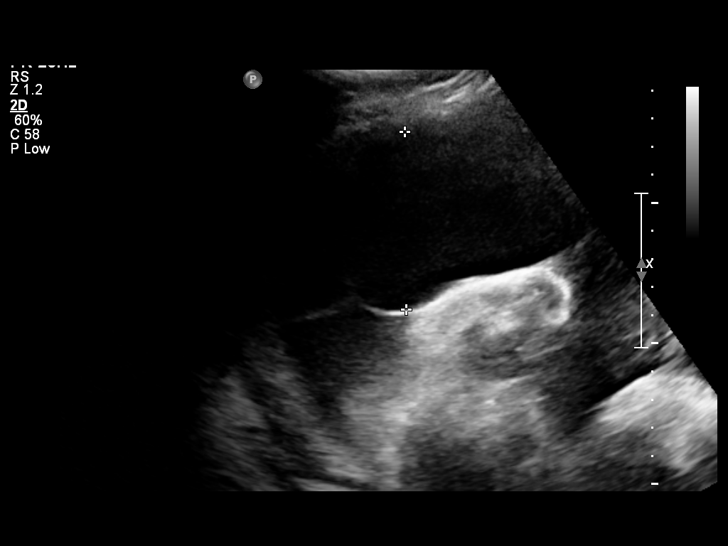
[im 15/32]
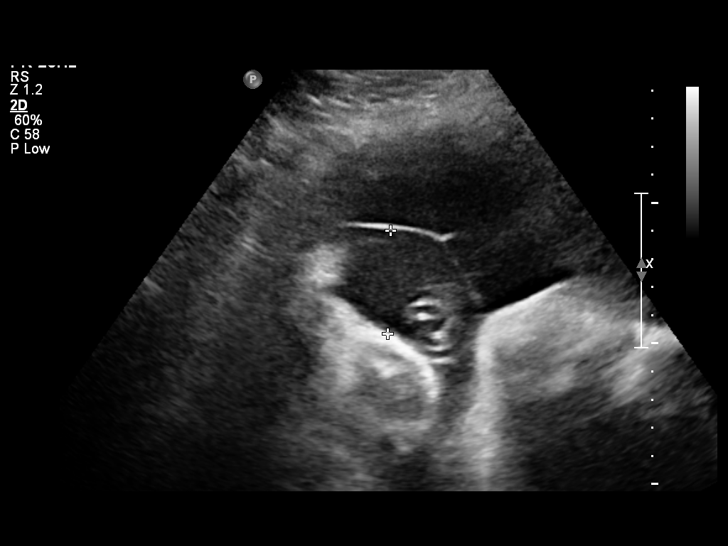
[im 18/32]
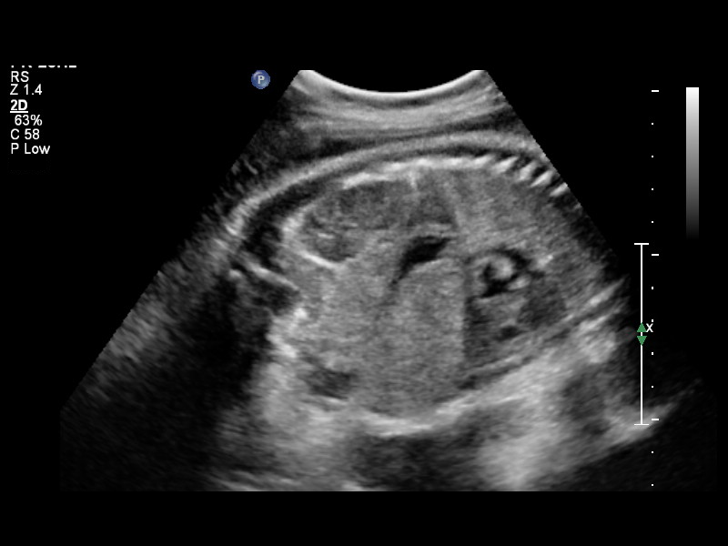
[im 20/32]
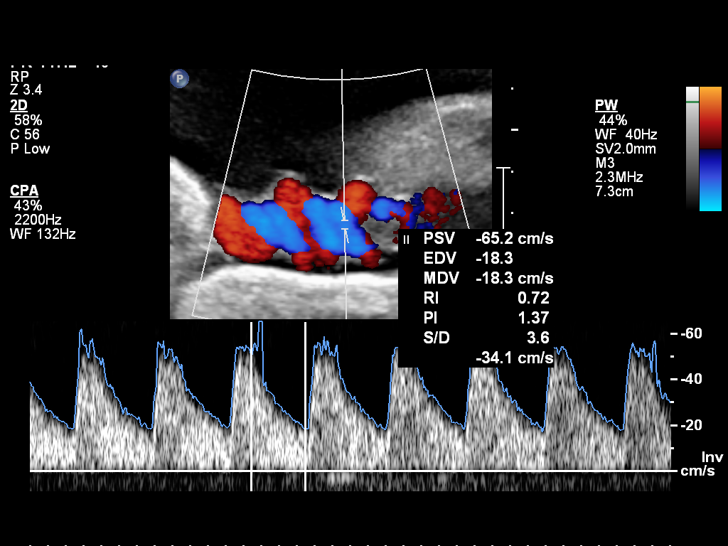
[im 22/32]
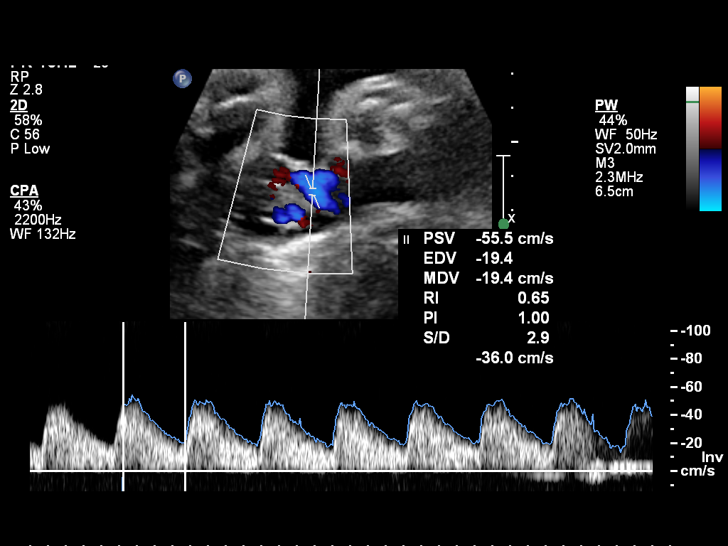
[im 25/32]
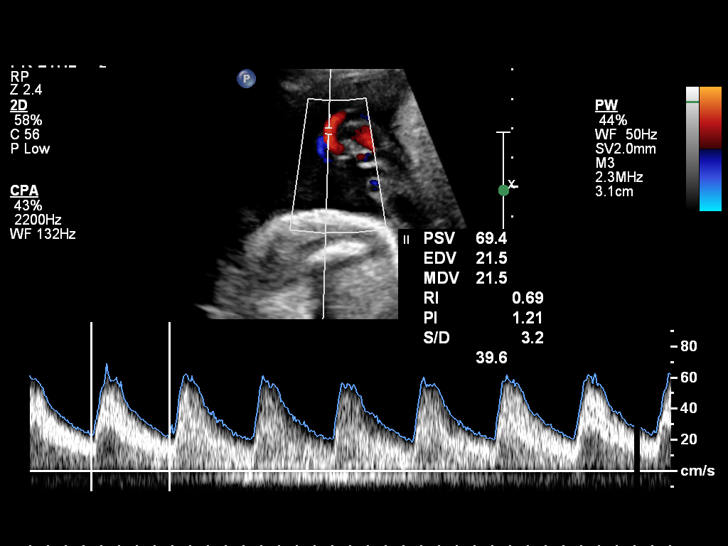
[im 27/32]
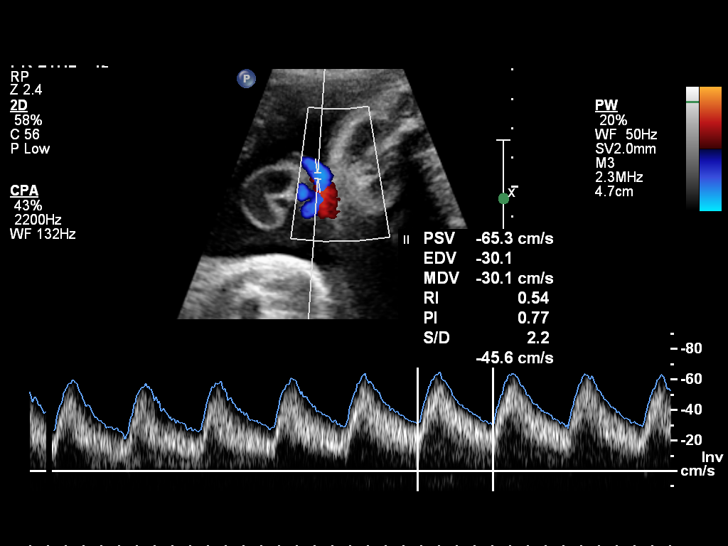
[im 29/32]
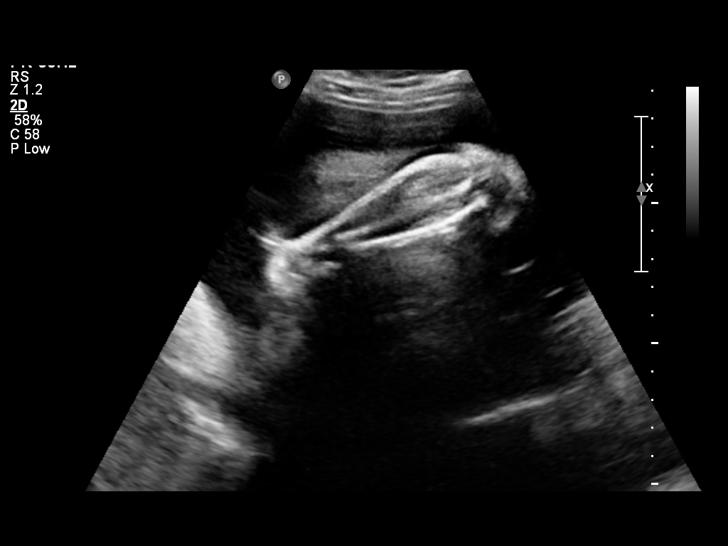
[im 32/32]
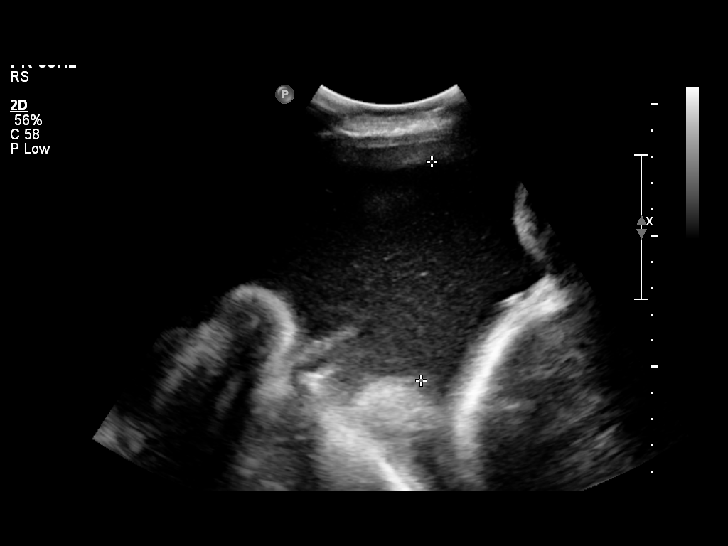

[14 of 28 positions shown; findings below may reference images not displayed]

Canned report from images found in remote index.

Refer to host system for actual result text.

## 2013-12-01 ENCOUNTER — Encounter (HOSPITAL_COMMUNITY): Payer: Self-pay | Admitting: Obstetrics & Gynecology

## 2016-05-17 ENCOUNTER — Ambulatory Visit (INDEPENDENT_AMBULATORY_CARE_PROVIDER_SITE_OTHER): Payer: Commercial Managed Care - PPO

## 2016-05-17 ENCOUNTER — Encounter: Payer: Self-pay | Admitting: Podiatry

## 2016-05-17 ENCOUNTER — Ambulatory Visit (INDEPENDENT_AMBULATORY_CARE_PROVIDER_SITE_OTHER): Payer: Commercial Managed Care - PPO | Admitting: Podiatry

## 2016-05-17 DIAGNOSIS — M722 Plantar fascial fibromatosis: Secondary | ICD-10-CM | POA: Diagnosis not present

## 2016-05-17 MED ORDER — DICLOFENAC SODIUM 75 MG PO TBEC: 75.0000 mg | DELAYED_RELEASE_TABLET | Freq: Two times a day (BID) | ORAL | 2 refills | Status: DC

## 2016-05-17 MED ORDER — TRIAMCINOLONE ACETONIDE 10 MG/ML IJ SUSP
10.0000 mg | Freq: Once | INTRAMUSCULAR | Status: AC
  Administered 2016-05-17: 10 mg

## 2016-05-17 NOTE — Progress Notes (Signed)
Subjective:     Patient ID: Miranda Bray, female   DOB: 18-Nov-1985, 31 y.o.   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      Objective:   Physical Exam  Cardiovascular: Intact distal pulses.   Musculoskeletal: Normal range of motion.  Neurological: She is alert.  Skin: Skin is warm.  Nursing note and vitals reviewed.  neurovascular status found to be intact with muscle strength adequate range of motion within normal limits with patient noted to have quite a bit of discomfort in the left arch mid arch area and mild discomfort in the plantar heel right over left. Patient's found to have depression of the arch and has good digital perfusion and is well oriented 3     Assessment:     Chronic fasciitis bilateral right over left with acute inflammation mid arch area left    Plan:     H&P x-rays reviewed and conditions discussed. At this time mid arch injection administered left 3 mg Kenalog 5 mg Xylocaine and instructed on physical therapy and dispensed fascial brace is to use bilateral to lift the arch is up. I then prescribed diclofenac and gave her instructions on physical therapy and reappoint to recheck again in the next several weeks   X-ray report indicates that there is plantar spur formation but no indication stress fracture arthritis with moderate depression of the arch

## 2016-05-17 NOTE — Patient Instructions (Signed)

## 2016-05-17 NOTE — Progress Notes (Signed)
   Subjective:    Patient ID: Miranda Bray, female    DOB: 29-Sep-1985, 31 y.o.   MRN: [id number]  HPI Chief Complaint  Patient presents with  . Foot Pain    Bilateral; bottom of heel-x1.5 yrs; Left foot-arch-medial side; x1 month      Review of Systems  Musculoskeletal: Positive for back pain.  All other systems reviewed and are negative.      Objective:   Physical Exam        Assessment & Plan:

## 2016-05-31 ENCOUNTER — Ambulatory Visit: Payer: Commercial Managed Care - PPO | Admitting: Podiatry

## 2016-06-09 ENCOUNTER — Ambulatory Visit: Payer: Commercial Managed Care - PPO | Admitting: Podiatry

## 2016-06-19 ENCOUNTER — Ambulatory Visit (INDEPENDENT_AMBULATORY_CARE_PROVIDER_SITE_OTHER): Payer: Commercial Managed Care - PPO | Admitting: Podiatry

## 2016-06-19 DIAGNOSIS — M722 Plantar fascial fibromatosis: Secondary | ICD-10-CM | POA: Diagnosis not present

## 2016-06-21 NOTE — Progress Notes (Signed)
Subjective:    Patient ID: Miranda Bray, female   DOB: 31 y.o.   MRN: [id number]   HPI patient states that the pain has receded by a significant amount and I still get occasional discomfort but much better    ROS      Objective:  Physical Exam Neurovascular status intact with significant diminishment of discomfort in the plantar fascial bilateral with pain still present upon deep palpation but improved quite a bit    Assessment:   Fasciitis-like symptoms present but improved      Plan:     Advised on physical therapy anti-inflammatories and the possibility for long-term orthotic treatment. Patient be seen back for us to recheck

## 2018-10-03 ENCOUNTER — Other Ambulatory Visit: Payer: Self-pay | Admitting: Obstetrics and Gynecology

## 2018-11-04 NOTE — H&P (Addendum)
Miranda Bray is a 33 y.o. female, P: 2-0-0-2 who presents  for tubal sterilization due to her desire to cease childbearing.  The patient has been using the Depo Provera injection for many years and  therefore does not have periods.  Prior to Depo Provera she had "heavy periods" characterized by an hourly pad change for 5 days.  Additionally she had cramping that was rated 6/10 on a 10 point pain scale but relieved with Motrin.  She denies any changes in bowel or bladder function, dyspareunia or vaginitis symptoms.  Though she is aware of other methods of contraception,  the patient desires definitive therapy in the form of bilateral salpingectomy.  Past Medical History  OB History: G:1;  P: 2-0-0-2; C-section 2013 with twins   GYN History: menarche: 33 YO    LMP: none due to Depo Provera   Denies history of abnormal PAP smear.   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   ROS: Admits to chronic nausea (has been evaluated), sensitive to smells but, denies headache, vision changes, nasal congestion, dysphagia, tinnitus, dizziness, hoarseness, cough,  chest pain, shortness of breath, vomiting, diarrhea,constipation,  urinary frequency, urgency  dysuria, hematuria, vaginitis symptoms, pelvic pain, swelling of joints,easy bruising,  myalgias, arthralgias, skin rashes, unexplained weight loss and except as is mentioned in the history of present illness, patient's review of  systems is otherwise negative.     Physical Exam Bp: 108/64   P: 69 bpm  R: 16  O2Sat. 98% room air  Weight: 158 lbs and Height: 5'1"  BMI: 29.9  Neck: supple without masses or thyromegaly Lungs: clear to auscultation Heart: regular rate and rhythm Abdomen: soft, non-tender and no organomegaly Pelvic:EGBUS- wnl; vagina-normal rugae; uterus-normal size, cervix without lesions or motion tenderness; adnexae-no tenderness or masses Extremities:  no clubbing, cyanosis or edema   Assesment: Desire for Sterilization   Disposition: Reviewed the risks of surgery to include, but not limited to: reaction to anesthesia, damage to adjacent organs, infection and excessive bleeding.  The patient verbalized understanding of these risks and has consented to proceed with Bilateral Salpingectomy at [facility] on November 25, 2018 at 1:30 p.m   CSN# [id number]   Jon Billings. Florene Glen, PA-C  for Dr Gwynneth Munson A. Dillard.

## 2018-11-25 ENCOUNTER — Encounter (HOSPITAL_BASED_OUTPATIENT_CLINIC_OR_DEPARTMENT_OTHER): Admission: RE | Payer: Self-pay | Source: Home / Self Care

## 2018-11-25 ENCOUNTER — Ambulatory Visit (HOSPITAL_BASED_OUTPATIENT_CLINIC_OR_DEPARTMENT_OTHER)
Admission: RE | Admit: 2018-11-25 | Payer: Commercial Managed Care - PPO | Source: Home / Self Care | Admitting: Obstetrics and Gynecology

## 2018-11-25 SURGERY — SALPINGECTOMY, BILATERAL, LAPAROSCOPIC
Anesthesia: General | Laterality: Bilateral

## 2019-07-29 ENCOUNTER — Ambulatory Visit: Payer: Self-pay | Attending: Family

## 2019-07-29 DIAGNOSIS — Z23 Encounter for immunization: Secondary | ICD-10-CM

## 2019-07-29 NOTE — Progress Notes (Signed)
   Covid-19 Vaccination Clinic  Name:  Miranda Bray    MRN: [id number] DOB: [birth year]  07/29/2019  Miranda Bray was observed post Covid-19 immunization for 15 minutes without incident. She was provided with Vaccine Information Sheet and instruction to access the V-Safe system.   Miranda Bray was instructed to call 911 with any severe reactions post vaccine: Marland Kitchen Difficulty breathing  . Swelling of face and throat  . A fast heartbeat  . A bad rash all over body  . Dizziness and weakness   Immunizations Administered    Name Date Dose VIS Date Route   Pfizer COVID-19 Vaccine 07/29/2019  1:22 PM 0.3 mL 03/26/2018 Intramuscular   Manufacturer: [employer], [employer]   Lot: [id number]   NDC: [address]-1

## 2019-08-14 ENCOUNTER — Other Ambulatory Visit: Payer: Self-pay

## 2019-08-14 ENCOUNTER — Encounter (HOSPITAL_BASED_OUTPATIENT_CLINIC_OR_DEPARTMENT_OTHER): Payer: Self-pay | Admitting: Obstetrics and Gynecology

## 2019-08-14 NOTE — Progress Notes (Signed)
Spoke w/ via phone for pre-op interview---pt Lab needs dos----  Urine poct             Lab results------none COVID test ------08-20-2019 at 800 am   Arrive at -------530 am 08-22-2019 NPO after MN NO Solid Food.  Clear liquids from MN until---430 am then npo Medications to take morning of surgery -----none Diabetic medication -----n/a Patient Special Instructions -----none Pre-Op special Istructions -----none Patient verbalized understanding of instructions that were given at this phone interview. Patient denies shortness of breath, chest pain, fever, cough a this phone interview.

## 2019-08-15 ENCOUNTER — Ambulatory Visit: Admit: 2019-08-15 | Payer: Commercial Managed Care - PPO | Admitting: Obstetrics and Gynecology

## 2019-08-15 SURGERY — SALPINGECTOMY, BILATERAL, LAPAROSCOPIC
Anesthesia: General

## 2019-08-18 ENCOUNTER — Other Ambulatory Visit: Payer: Self-pay | Admitting: Obstetrics and Gynecology

## 2019-08-19 ENCOUNTER — Ambulatory Visit: Payer: Managed Care, Other (non HMO) | Attending: Family

## 2019-08-19 DIAGNOSIS — Z23 Encounter for immunization: Secondary | ICD-10-CM

## 2019-08-19 NOTE — Progress Notes (Signed)
   Covid-19 Vaccination Clinic  Name:  SAJA BARTOLINI    MRN: [id number] DOB: [birth year]  08/19/2019  Ms. Whiteside was observed post Covid-19 immunization for 15 minutes without incident. She was provided with Vaccine Information Sheet and instruction to access the V-Safe system.   Ms. Stretch was instructed to call 911 with any severe reactions post vaccine: Marland Kitchen Difficulty breathing  . Swelling of face and throat  . A fast heartbeat  . A bad rash all over body  . Dizziness and weakness   Immunizations Administered    Name Date Dose VIS Date Route   Pfizer COVID-19 Vaccine 08/19/2019 12:58 PM 0.3 mL 03/26/2018 Intramuscular   Manufacturer: [employer], [employer]   Lot: [id number]   NDC: [address]-6

## 2019-08-20 ENCOUNTER — Inpatient Hospital Stay (HOSPITAL_COMMUNITY): Admission: RE | Admit: 2019-08-20 | Payer: Self-pay | Source: Ambulatory Visit

## 2019-08-22 ENCOUNTER — Ambulatory Visit (HOSPITAL_BASED_OUTPATIENT_CLINIC_OR_DEPARTMENT_OTHER)
Admission: RE | Admit: 2019-08-22 | Payer: Managed Care, Other (non HMO) | Source: Home / Self Care | Admitting: Obstetrics and Gynecology

## 2019-08-22 HISTORY — DX: Excessive and frequent menstruation with regular cycle: N92.0

## 2019-08-22 SURGERY — SALPINGECTOMY, BILATERAL, LAPAROSCOPIC
Anesthesia: General

## 2020-01-26 ENCOUNTER — Other Ambulatory Visit: Payer: Managed Care, Other (non HMO)

## 2020-01-26 DIAGNOSIS — Z20822 Contact with and (suspected) exposure to covid-19: Secondary | ICD-10-CM

## 2020-01-28 LAB — NOVEL CORONAVIRUS, NAA: SARS-CoV-2, NAA: DETECTED — AB

## 2020-01-28 LAB — SARS-COV-2, NAA 2 DAY TAT

## 2020-04-19 ENCOUNTER — Other Ambulatory Visit: Payer: Self-pay

## 2020-04-19 ENCOUNTER — Encounter (HOSPITAL_COMMUNITY): Payer: Self-pay

## 2020-04-19 ENCOUNTER — Ambulatory Visit (HOSPITAL_COMMUNITY)
Admission: EM | Admit: 2020-04-19 | Discharge: 2020-04-19 | Disposition: A | Discharge status: Home / Self Care | Payer: Managed Care, Other (non HMO)

## 2020-04-19 DIAGNOSIS — M5416 Radiculopathy, lumbar region: Secondary | ICD-10-CM | POA: Diagnosis not present

## 2020-04-19 DIAGNOSIS — Z3042 Encounter for surveillance of injectable contraceptive: Secondary | ICD-10-CM | POA: Diagnosis not present

## 2020-04-19 MED ORDER — PREDNISONE 10 MG (21) PO TBPK
ORAL_TABLET | Freq: Every day | ORAL | 0 refills | Status: AC
Start: 2020-04-19 — End: ?

## 2020-04-19 MED ORDER — METAXALONE 800 MG PO TABS: 800.0000 mg | ORAL_TABLET | Freq: Three times a day (TID) | ORAL | 0 refills | Status: AC

## 2020-04-19 NOTE — ED Provider Notes (Signed)
[facility]    CSN: [id number] Arrival date & time: 04/19/20  0383      History   Chief Complaint Chief Complaint  Patient presents with  . Numbness  . Right side heaviness    HPI APHRODITE HARPENAU is a 35 y.o. female presenting for new onset of LE numbness and "heaviness."  Describes  4 days of symptoms, unchanged.  Notes numbness and heaviness of bilateral lower extremities, right worse than left.  States numbness starts at her belly button and radiates down legs; it is worse over the outer thighs but there is some on the inner thighs and calves as well. Numbness is not spreading upwards or downwards, or getting worse. 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 denies groin anesthesia.  Denies bowel or bladder changes, including urinary incontinence, constipation, urinary retention.  Denies back pain. Denies pain radiating down legs. Denies abdominal pain, urinary symptoms. Denies URI symptoms.  States she has been on Depo for contraception for years.  Occasionally gets her period on this, and states that she just started her period   HPI  Past Medical History:  Diagnosis Date  . Heavy menses   . History of chicken pox as child  . History of chlamydia     There are no problems to display for this patient.   Past Surgical History:  Procedure Laterality Date  . CESAREAN SECTION  02/06/2011   Procedure: CESAREAN SECTION;  Surgeon: [name];  Location: [facility];  Service: Gynecology;  Laterality: N/A;  Primary/twins  . NO PAST SURGERIES    . right knee arthroscopy  24/2015   meniscus repair    OB History    Gravida  1   Para  1   Term  0   Preterm  1   AB  0   Living  0     SAB  0   IAB  0   Ectopic  0   Multiple  1   Live Births               Home Medications    Prior to Admission medications   Medication Sig Start Date End Date Taking? Authorizing Provider   metaxalone (SKELAXIN) 800 MG tablet Take 1 tablet (800 mg total) by mouth 3 (three) times daily. 04/19/20  Yes Rhys Martini, PA-C  predniSONE (STERAPRED UNI-PAK 21 TAB) 10 MG (21) TBPK tablet Take by mouth daily. Take 6 tabs by mouth daily  for 2 days, then 5 tabs for 2 days, then 4 tabs for 2 days, then 3 tabs for 2 days, 2 tabs for 2 days, then 1 tab by mouth daily for 2 days 04/19/20  Yes Rhys Martini, PA-C  Biotin 1000 MCG tablet Take 1,000 mcg by mouth daily. 10000 mcg daily    [provider]  Ginger, Zingiber officinalis, (GINGER PO) Take by mouth.    [provider]  medroxyPROGESTERone (DEPO-PROVERA) 150 MG/ML injection ADMINISTER 1 ML IN THE MUSCLE EVERY 3 MONTHS FOR 1 DAY AS DIRECTED 02/18/20   [provider]  OVER THE COUNTER MEDICATION Vitamin b 12 5000 units daily    [provider]  OVER THE COUNTER MEDICATION Fish oil 1400 units daily    [provider]  OVER THE COUNTER MEDICATION Immune support elderberry, vit c, zinc, echanesia, zinc, vit e    [provider]  TURMERIC PO Take  by mouth.    [provider]  UNABLE TO FIND deprovera shot last done may 2021    [provider]    Family History Family History  Problem Relation Age of Onset  . Anemia Sister   . Hypertension Maternal Aunt   . Diabetes Maternal Aunt   . Cancer Maternal Aunt        breast x3  . Drug abuse Paternal Aunt   . Drug abuse Paternal Uncle   . Cancer Maternal Grandmother        breast  . Alcohol abuse Maternal Grandfather   . Cancer Paternal Grandfather        prostate  . Anesthesia problems Neg Hx     Social History Social History   Tobacco Use  . Smoking status: Never Smoker  . Smokeless tobacco: Never Used  Vaping Use  . Vaping Use: Never used  Substance Use Topics  . Alcohol use: Yes    Comment: rare  . Drug use: No     Allergies   Patient has no known allergies.   Review of Systems Review of Systems   Constitutional: Negative for appetite change, chills, fatigue and fever.  HENT: Negative for congestion, sinus pressure, sore throat, trouble swallowing and voice change.   Eyes: Negative for photophobia, pain, discharge, redness, itching and visual disturbance.  Respiratory: Negative for cough, chest tightness and shortness of breath.   Cardiovascular: Negative for chest pain, palpitations and leg swelling.  Gastrointestinal: Negative for abdominal pain, constipation, diarrhea, nausea and vomiting.  Genitourinary: Negative for decreased urine volume, difficulty urinating, dysuria, flank pain, frequency, genital sores, hematuria, menstrual problem, pelvic pain, urgency, vaginal bleeding, vaginal discharge and vaginal pain.  Musculoskeletal: Negative for back pain, gait problem, myalgias, neck pain and neck stiffness.  Neurological: Positive for numbness. Negative for dizziness, tremors, seizures, syncope, facial asymmetry, speech difficulty, weakness, light-headedness and headaches.  Psychiatric/Behavioral: Negative for agitation, decreased concentration, dysphoric mood, hallucinations and suicidal ideas. The patient is not nervous/anxious.   All other systems reviewed and are negative.    Physical Exam Triage Vital Signs ED Triage Vitals  Enc Vitals Group     BP 04/19/20 0924 (!) 144/76     Pulse Rate 04/19/20 0924 81     Resp 04/19/20 0924 18     Temp 04/19/20 0924 98.4 F (36.9 C)     Temp src --      SpO2 04/19/20 0924 98 %     Weight --      Height --      Head Circumference --      Peak Flow --      Pain Score 04/19/20 0921 4     Pain Loc --      Pain Edu? --      Excl. in GC? --    No data found.  Updated Vital Signs BP 123/84   Pulse 81   Temp 98.4 F (36.9 C)   Resp 18   LMP 04/16/2020 (Approximate)   SpO2 98%   Breastfeeding No   Visual Acuity Right Eye Distance:   Left Eye Distance:   Bilateral Distance:    Right Eye Near:   Left Eye Near:     Bilateral Near:     Physical Exam Vitals reviewed.  Constitutional:      General: She is not in acute distress.    Appearance: Normal appearance. She is not ill-appearing.  HENT:     Head: Normocephalic and atraumatic.  Eyes:     Extraocular Movements: Extraocular movements intact.     Pupils: Pupils are equal, round, and reactive to light.  Cardiovascular:     Rate and Rhythm: Normal rate and regular rhythm.     Pulses:          Radial pulses are 2+ on the right side and 2+ on the left side.     Heart sounds: Normal heart sounds.  Pulmonary:     Effort: Pulmonary effort is normal.     Breath sounds: Normal breath sounds and air entry.  Abdominal:     Palpations: Abdomen is soft.     Tenderness: There is no abdominal tenderness. There is no right CVA tenderness, left CVA tenderness, guarding or rebound.     Comments: No bowel or bladder incontinence.  Musculoskeletal:     Cervical back: Normal range of motion. No swelling, deformity, signs of trauma, rigidity, spasms, tenderness, bony tenderness or crepitus. No pain with movement.     Thoracic back: No swelling, deformity, signs of trauma, spasms, tenderness or bony tenderness. Normal range of motion. No scoliosis.     Lumbar back: No swelling, deformity, signs of trauma, spasms, tenderness or bony tenderness. Normal range of motion. Negative right straight leg raise test and negative left straight leg raise test. No scoliosis.     Right lower leg: No edema.     Left lower leg: No edema.     Comments: Strength 5/5 in UEs and LEs. Gait intact. No spinous, paraspinous, hip, knee, ankle swelling or tenderness. No calf swelling or tenderness. No venous distension. No pedal edema.  Neurological:     General: No focal deficit present.     Mental Status: She is alert and oriented to person, place, and time.     Cranial Nerves: Cranial nerves are intact. No cranial nerve deficit.     Sensory: Sensation is intact.     Motor: Motor  function is intact. No weakness, tremor or pronator drift.     Coordination: Coordination is intact.     Gait: Gait is intact. Gait normal.     Comments: Strength 5/5 in UEs and LEs. Gait normal.  CN 2-12 grossly intact. Sensation is decreased in LEs but it is still intact bilaterally.   Psychiatric:        Mood and Affect: Mood normal.        Behavior: Behavior normal.        Thought Content: Thought content normal.        Judgment: Judgment normal.      UC Treatments / Results  Labs (all labs ordered are listed, but only abnormal results are displayed) Labs Reviewed - No data to display  EKG   Radiology No results found.  Procedures Procedures (including critical care time)  Medications Ordered in UC Medications - No data to display  Initial Impression / Assessment and Plan / UC Course  I have reviewed the triage vital signs and the nursing notes.  Pertinent labs & imaging results that were available during my care of the patient were reviewed by me and considered in my medical decision making (see chart for details).     This patient is a 35 year old female presenting with lumbar radiculopathy. Benign neuro exam today. Low concern for cauda equina given sensation in inner thighs, no bowel or bladder weakness, strength LEs intact. STRICT return precautions extensively discussed.   Plan to treat with prednisone and zanaflex as below. 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   States she could not be pregnant, she is on Depo and also having her period right now.   Strict return precautions discussed.  This chart was dictated using voice recognition software, Dragon. Despite the best efforts of this provider to proofread and correct errors, errors may still occur which can change documentation meaning.  Final Clinical Impressions(s) / UC Diagnoses   Final diagnoses:  Lumbar nerve root impingement  Depo-Provera contraceptive status      Discharge Instructions     -Start the prednisone taper. Take 6 tabs by mouth daily  for 2 days, then 5 tabs for 2 days, then 4 tabs for 2 days, then 3 tabs for 2 days, 2 tabs for 2 days, then 1 tab by mouth daily for 2 days.  This can give you energy, so take during the morning. -Also start the muscle relaxer-metaxalone (Skelaxin), up to 3 times daily for muscular pain.  This can cause drowsiness, so try this at nighttime or when you don't have to drive. -You can also take ibuprofen or Tylenol along with this regimen. -If your symptoms get significantly worse, like numbness in your groin and in your legs, trouble passing urine or stool, significant weakness in your legs-head straight to the ER for further evaluation and management.  This can be signs of a serious condition called cauda equina. -If your symptoms improve but don't get all the way better, follow-up with neurosurgery for an outpatient work-up including MRI.  Information below.  Your insurance may let you schedule this without a referral, but if you need referral, follow-up with your primary care for this. -I provided a work note until this Thursday.  If you are still having symptoms then, follow-up with us or your primary care for further evaluation and additional work-up.     ED Prescriptions    Medication Sig Dispense Auth. Provider   predniSONE (STERAPRED UNI-PAK 21 TAB) 10 MG (21) TBPK tablet Take by mouth daily. Take 6 tabs by mouth daily  for 2 days, then 5 tabs for 2 days, then 4 tabs for 2 days, then 3 tabs for 2 days, 2 tabs for 2 days, then 1 tab by mouth daily for 2 days 42 tablet Rhys MartiniGraham, Jailyn Leeson E, PA-C   metaxalone (SKELAXIN) 800 MG tablet Take 1 tablet (800 mg total) by mouth 3 (three) times daily. 21 tablet Rhys MartiniGraham, Leontyne Manville E, PA-C     PDMP not reviewed this encounter.   Rhys MartiniGraham, Samer Dutton E, PA-C 04/19/20 1227

## 2020-04-19 NOTE — ED Triage Notes (Signed)
Pt in with c/o right side feeling heavy and left leg numbness that started on Friday  Denies any vision changes or slurred speech  States when she scratches her left leg she can't feel it

## 2020-04-19 NOTE — Discharge Instructions (Addendum)
-  Start the prednisone taper. Take 6 tabs by mouth daily  for 2 days, then 5 tabs for 2 days, then 4 tabs for 2 days, then 3 tabs for 2 days, 2 tabs for 2 days, then 1 tab by mouth daily for 2 days.  This can give you energy, so take during the morning. -Also start the muscle relaxer-metaxalone (Skelaxin), up to 3 times daily for muscular pain.  This can cause drowsiness, so try this at nighttime or when you don't have to drive. -You can also take ibuprofen or Tylenol along with this regimen. -If your symptoms get significantly worse, like numbness in your groin and in your legs, trouble passing urine or stool, significant weakness in your legs-head straight to the ER for further evaluation and management.  This can be signs of a serious condition called cauda equina. -If your symptoms improve but don't get all the way better, follow-up with neurosurgery for an outpatient work-up including MRI.  Information below.  Your insurance may let you schedule this without a referral, but if you need referral, follow-up with your primary care for this. -I provided a work note until this Thursday.  If you are still having symptoms then, follow-up with [place] or your primary care for further evaluation and additional work-up.

## 2021-05-18 ENCOUNTER — Other Ambulatory Visit: Payer: Self-pay | Admitting: Obstetrics and Gynecology

## 2021-08-05 ENCOUNTER — Other Ambulatory Visit: Payer: Self-pay | Admitting: Obstetrics and Gynecology

## 2022-03-31 DIAGNOSIS — Z419 Encounter for procedure for purposes other than remedying health state, unspecified: Secondary | ICD-10-CM | POA: Diagnosis not present

## 2022-05-01 DIAGNOSIS — Z419 Encounter for procedure for purposes other than remedying health state, unspecified: Secondary | ICD-10-CM | POA: Diagnosis not present

## 2022-05-31 DIAGNOSIS — Z419 Encounter for procedure for purposes other than remedying health state, unspecified: Secondary | ICD-10-CM | POA: Diagnosis not present

## 2022-07-01 DIAGNOSIS — Z419 Encounter for procedure for purposes other than remedying health state, unspecified: Secondary | ICD-10-CM | POA: Diagnosis not present

## 2022-10-31 DIAGNOSIS — Z419 Encounter for procedure for purposes other than remedying health state, unspecified: Secondary | ICD-10-CM | POA: Diagnosis not present

## 2022-12-01 DIAGNOSIS — Z419 Encounter for procedure for purposes other than remedying health state, unspecified: Secondary | ICD-10-CM | POA: Diagnosis not present

## 2023-01-31 DIAGNOSIS — Z419 Encounter for procedure for purposes other than remedying health state, unspecified: Secondary | ICD-10-CM | POA: Diagnosis not present

## 2023-03-03 DIAGNOSIS — Z419 Encounter for procedure for purposes other than remedying health state, unspecified: Secondary | ICD-10-CM | POA: Diagnosis not present

## 2023-03-31 DIAGNOSIS — Z419 Encounter for procedure for purposes other than remedying health state, unspecified: Secondary | ICD-10-CM | POA: Diagnosis not present

## 2023-05-12 DIAGNOSIS — Z419 Encounter for procedure for purposes other than remedying health state, unspecified: Secondary | ICD-10-CM | POA: Diagnosis not present

## 2023-06-11 DIAGNOSIS — Z419 Encounter for procedure for purposes other than remedying health state, unspecified: Secondary | ICD-10-CM | POA: Diagnosis not present

## 2023-07-12 DIAGNOSIS — Z419 Encounter for procedure for purposes other than remedying health state, unspecified: Secondary | ICD-10-CM | POA: Diagnosis not present

## 2023-10-12 DIAGNOSIS — Z419 Encounter for procedure for purposes other than remedying health state, unspecified: Secondary | ICD-10-CM | POA: Diagnosis not present
